# Patient Record
Sex: Female | Born: 1990 | Race: Black or African American | Hispanic: No | Marital: Single | Smoking: Never smoker
Health system: Southern US, Community
[De-identification: ages and names within clinical notes are randomized; demographics above are authoritative.]

## PROBLEM LIST (undated history)

## (undated) DIAGNOSIS — Z8619 Personal history of other infectious and parasitic diseases: Secondary | ICD-10-CM

---

## 2016-10-01 ENCOUNTER — Ambulatory Visit (HOSPITAL_COMMUNITY)
Admission: EM | Admit: 2016-10-01 | Discharge: 2016-10-01 | Disposition: A | Discharge status: Home / Self Care | Payer: Self-pay | Attending: Internal Medicine | Admitting: Internal Medicine

## 2016-10-01 ENCOUNTER — Encounter (HOSPITAL_COMMUNITY): Payer: Self-pay | Admitting: Emergency Medicine

## 2016-10-01 DIAGNOSIS — R21 Rash and other nonspecific skin eruption: Secondary | ICD-10-CM

## 2016-10-01 HISTORY — DX: Personal history of other infectious and parasitic diseases: Z86.19

## 2016-10-01 MED ORDER — VALACYCLOVIR HCL 1 G PO TABS: 1000.0000 mg | ORAL_TABLET | Freq: Three times a day (TID) | ORAL | 0 refills | Status: DC

## 2016-10-01 MED ORDER — HYDROCORTISONE 1 % EX CREA: TOPICAL_CREAM | CUTANEOUS | 0 refills | Status: DC

## 2016-10-01 NOTE — Discharge Instructions (Signed)
°  No Primary Care Doctor: °Call [facility] at  [phone] - they can help you locate a primary care doctor that  accepts your insurance, provides certain services, etc. °Physician Referral Service- [phone] ° ° ° °

## 2016-10-01 NOTE — ED Provider Notes (Signed)
CSN: [id number]     Arrival date & time 10/01/16  1543 History   First MD Initiated Contact with Patient 10/01/16 1700     Chief Complaint  Patient presents with  . Rash   (Consider location/radiation/quality/duration/timing/severity/associated sxs/prior Treatment) HPI  Yolanda Fisher is a 26 y.o. female presenting to UC with c/o rash to the Right side of her neck that is mildly burning and itching.  Started yesterday.  She reports hx of shingles above Right eye when she was in the 3rd grade but does not recall ever seeing a specialist for it.  She notes she has been getting intermittent body and joint pain recently and mild headache.  She thinks current rash is recurrence of shingles. No known sick contacts. No recent travel. No fever or chills. No new soaps, lotions, or medications.    Past Medical History:  Diagnosis Date  . History of shingles    History reviewed. No pertinent surgical history. History reviewed. No pertinent family history. Social History  Substance Use Topics  . Smoking status: Never Smoker  . Smokeless tobacco: Never Used  . Alcohol use Yes   OB History    No data available     Review of Systems  Constitutional: Negative for chills and fever.  Gastrointestinal: Negative for abdominal pain, diarrhea, nausea and vomiting.  Musculoskeletal: Positive for arthralgias and myalgias. Negative for joint swelling, neck pain and neck stiffness.  Skin: Positive for color change and rash. Negative for wound.    Allergies  Patient has no known allergies.  Home Medications   Prior to Admission medications   Medication Sig Start Date End Date Taking? Authorizing Provider  hydrocortisone cream 1 % Apply to affected area 2 times daily 10/01/16   Junius Finner'Malley, Aunna Snooks, PA-C  valACYclovir (VALTREX) 1000 MG tablet Take 1 tablet (1,000 mg total) by mouth 3 (three) times daily. 10/01/16   Junius Finner'Malley, Koryn Charlot, PA-C   Meds Ordered and Administered this Visit  Medications - No data to  display  BP 135/76 (BP Location: Left Arm)   Pulse 91   Temp 98.4 F (36.9 C) (Oral)   Resp 16   SpO2 100%  No data found.   Physical Exam  Constitutional: She is oriented to person, place, and time. She appears well-developed and well-nourished. No distress.  HENT:  Head: Normocephalic and atraumatic.  Right Ear: Tympanic membrane normal. No drainage, swelling or tenderness.  Left Ear: Tympanic membrane normal. No drainage, swelling or tenderness.  Nose: Nose normal.  Mouth/Throat: Uvula is midline, oropharynx is clear and moist and mucous membranes are normal.  Eyes: EOM are normal.  Neck: Normal range of motion. Neck supple.  Cardiovascular: Normal rate and regular rhythm.   Pulmonary/Chest: Effort normal and breath sounds normal. No stridor. No respiratory distress. She has no wheezes. She has no rales.  Musculoskeletal: Normal range of motion.  Lymphadenopathy:    She has no cervical adenopathy.  Neurological: She is alert and oriented to person, place, and time.  Skin: Skin is warm and dry. Rash noted. She is not diaphoretic. There is erythema.  Faint erythematous papular rash in linear fashion on Right side of neck, continues around posterior neck. Rash does cross midline onto Left side. minimally tender.   Psychiatric: She has a normal mood and affect. Her behavior is normal.  Nursing note and vitals reviewed.   Urgent Care Course     Procedures (including critical care time)  Labs Review Labs Reviewed - No data to display  Imaging Review No results found.    MDM   1. Rash and nonspecific skin eruption    Pt states rash is c/w prior shingles rash when she was in 3rd grade.  Rash looks c/w shingles, however, it does cross midline. Uncharacteristic of shingles. Will start pt on valtrex, however, will also prescribe hydrocortisone cream to help with irritation of rash.  F/u with PCP. Resource guide provided. encouraged to f/u sooner if she develops pain or  rash on Right ear. Home care instructions provided.    Junius Finner, PA-C 10/01/16 1911

## 2016-10-01 NOTE — ED Triage Notes (Signed)
The [facility] with a complaint of a rash on her neck that she believed to be a recurrence of shingles.

## 2020-04-21 ENCOUNTER — Other Ambulatory Visit: Payer: Self-pay

## 2020-04-21 ENCOUNTER — Emergency Department (INDEPENDENT_AMBULATORY_CARE_PROVIDER_SITE_OTHER)
Admission: EM | Admit: 2020-04-21 | Discharge: 2020-04-21 | Disposition: A | Discharge status: Home / Self Care | Payer: Self-pay | Source: Home / Self Care

## 2020-04-21 ENCOUNTER — Other Ambulatory Visit (HOSPITAL_COMMUNITY)
Admission: RE | Admit: 2020-04-21 | Discharge: 2020-04-21 | Disposition: A | Discharge status: Home / Self Care | Payer: Medicaid - Out of State | Source: Ambulatory Visit | Attending: Family Medicine | Admitting: Family Medicine

## 2020-04-21 DIAGNOSIS — N760 Acute vaginitis: Secondary | ICD-10-CM

## 2020-04-21 DIAGNOSIS — N898 Other specified noninflammatory disorders of vagina: Secondary | ICD-10-CM | POA: Diagnosis not present

## 2020-04-21 DIAGNOSIS — Z8742 Personal history of other diseases of the female genital tract: Secondary | ICD-10-CM | POA: Diagnosis not present

## 2020-04-21 DIAGNOSIS — R109 Unspecified abdominal pain: Secondary | ICD-10-CM | POA: Insufficient documentation

## 2020-04-21 DIAGNOSIS — R300 Dysuria: Secondary | ICD-10-CM | POA: Diagnosis not present

## 2020-04-21 DIAGNOSIS — B9689 Other specified bacterial agents as the cause of diseases classified elsewhere: Secondary | ICD-10-CM

## 2020-04-21 LAB — POCT URINALYSIS DIP (MANUAL ENTRY)
Bilirubin, UA: NEGATIVE
Blood, UA: NEGATIVE
Glucose, UA: NEGATIVE mg/dL
Leukocytes, UA: NEGATIVE
Nitrite, UA: NEGATIVE
Protein Ur, POC: NEGATIVE mg/dL
Spec Grav, UA: 1.025 (ref 1.010–1.025)
Urobilinogen, UA: 2 E.U./dL — AB
pH, UA: 7 (ref 5.0–8.0)

## 2020-04-21 MED ORDER — METRONIDAZOLE 0.75 % VA GEL: 1.0000 | Freq: Two times a day (BID) | VAGINAL | 0 refills | Status: DC

## 2020-04-21 NOTE — ED Provider Notes (Signed)
[facility]    CSN: [id number] Arrival date & time: 04/21/20  1451      History   Chief Complaint Chief Complaint  Patient presents with  . Dysuria  . Vaginal Discharge    HPI Yolanda Fisher is a 29 y.o. female.   Patient complains of vaginal discharge for 2 weeks.  History of BV.  She has had some abdominal cramping.  HPI  Past Medical History:  Diagnosis Date  . History of shingles     There are no problems to display for this patient.   History reviewed. No pertinent surgical history.  OB History   No obstetric history on file.      Home Medications    Prior to Admission medications   Medication Sig Start Date End Date Taking? Authorizing Provider  hydrocortisone cream 1 % Apply to affected area 2 times daily 10/01/16   Lurene Shadow, PA-C  valACYclovir (VALTREX) 1000 MG tablet Take 1 tablet (1,000 mg total) by mouth 3 (three) times daily. 10/01/16   Lurene Shadow, PA-C    Family History History reviewed. No pertinent family history.  Social History Social History   Tobacco Use  . Smoking status: Never Smoker  . Smokeless tobacco: Never Used  Vaping Use  . Vaping Use: Never used  Substance Use Topics  . Alcohol use: Yes  . Drug use: No     Allergies   Patient has no known allergies.   Review of Systems Review of Systems  Gastrointestinal: Positive for abdominal pain.  Genitourinary: Positive for vaginal discharge.     Physical Exam Triage Vital Signs ED Triage Vitals  Enc Vitals Group     BP 04/21/20 1528 129/74     Pulse Rate 04/21/20 1528 88     Resp 04/21/20 1528 18     Temp 04/21/20 1528 98.4 F (36.9 C)     Temp Source 04/21/20 1528 Oral     SpO2 04/21/20 1528 99 %     Weight --      Height --      Head Circumference --      Peak Flow --      Pain Score 04/21/20 1521 3     Pain Loc --      Pain Edu? --      Excl. in GC? --    No data found.  Updated Vital Signs BP 129/74 (BP Location: Right Arm)    Pulse 88   Temp 98.4 F (36.9 C) (Oral)   Resp 18   LMP 04/05/2020   SpO2 99%   Visual Acuity Right Eye Distance:   Left Eye Distance:   Bilateral Distance:    Right Eye Near:   Left Eye Near:    Bilateral Near:     Physical Exam Vitals and nursing note reviewed.  Constitutional:      Appearance: Normal appearance.  Cardiovascular:     Rate and Rhythm: Normal rate.  Pulmonary:     Effort: Pulmonary effort is normal.  Genitourinary:    Comments: Self collected vaginal specimen will check for BV chlamydia trichomonas Neurological:     Mental Status: She is alert.      UC Treatments / Results  Labs (all labs ordered are listed, but only abnormal results are displayed) Labs Reviewed  POCT URINALYSIS DIP (MANUAL ENTRY) - Abnormal; Notable for the following components:      Result Value   Ketones, POC UA trace (5) (*)  Urobilinogen, UA 2.0 (*)    All other components within normal limits    EKG   Radiology No results found.  Procedures Procedures (including critical care time)  Medications Ordered in UC Medications - No data to display  Initial Impression / Assessment and Plan / UC Course  I have reviewed the triage vital signs and the nursing notes.  Pertinent labs & imaging results that were available during my care of the patient were reviewed by me and considered in my medical decision making (see chart for details).     By history, symptoms are consistent with bacterial vaginosis we will submit cytology for prove.  Patient requests topical metronidazole since she is still breast-feeding. Final Clinical Impressions(s) / UC Diagnoses   Final diagnoses:  None   Discharge Instructions   None    ED Prescriptions    None     PDMP not reviewed this encounter.   Frederica Kuster, MD 04/21/20 [phone]

## 2020-04-21 NOTE — ED Triage Notes (Signed)
Pt c/o dysuria and abnormal vaginal discharge x 2 weeks. Hx of BV. Some abd cramping x 2 days.

## 2020-04-22 LAB — CYTOLOGY, (ORAL, ANAL, URETHRAL) ANCILLARY ONLY
Bacterial Vaginitis (gardnerella): POSITIVE — AB
Chlamydia: NEGATIVE
Comment: NEGATIVE
Comment: NEGATIVE
Comment: NEGATIVE
Comment: NORMAL
Neisseria Gonorrhea: NEGATIVE
Trichomonas: NEGATIVE

## 2020-06-01 ENCOUNTER — Encounter (HOSPITAL_COMMUNITY): Payer: Self-pay | Admitting: *Deleted

## 2020-06-01 ENCOUNTER — Emergency Department (HOSPITAL_COMMUNITY)
Admission: EM | Admit: 2020-06-01 | Discharge: 2020-06-01 | Disposition: A | Discharge status: Home / Self Care | Payer: Medicaid - Out of State | Attending: Emergency Medicine | Admitting: Emergency Medicine

## 2020-06-01 ENCOUNTER — Other Ambulatory Visit: Payer: Self-pay

## 2020-06-01 DIAGNOSIS — Z5321 Procedure and treatment not carried out due to patient leaving prior to being seen by health care provider: Secondary | ICD-10-CM | POA: Insufficient documentation

## 2020-06-01 DIAGNOSIS — M79662 Pain in left lower leg: Secondary | ICD-10-CM | POA: Insufficient documentation

## 2020-06-01 NOTE — ED Notes (Signed)
Pt states she needs to go. Did not sign

## 2020-06-01 NOTE — ED Triage Notes (Signed)
Pain in left calf for 2 weeks

## 2020-06-02 ENCOUNTER — Encounter (HOSPITAL_COMMUNITY): Payer: Self-pay | Admitting: Emergency Medicine

## 2020-06-02 ENCOUNTER — Emergency Department (HOSPITAL_COMMUNITY): Payer: Medicaid - Out of State

## 2020-06-02 ENCOUNTER — Emergency Department (HOSPITAL_COMMUNITY)
Admission: EM | Admit: 2020-06-02 | Discharge: 2020-06-02 | Disposition: A | Discharge status: Home / Self Care | Payer: Medicaid - Out of State | Attending: Emergency Medicine | Admitting: Emergency Medicine

## 2020-06-02 DIAGNOSIS — M79662 Pain in left lower leg: Secondary | ICD-10-CM | POA: Insufficient documentation

## 2020-06-02 NOTE — ED Triage Notes (Signed)
Pt c/o left leg pain that started the end of December.

## 2020-06-02 NOTE — Discharge Instructions (Addendum)
Your ultrasound is negative for any sign of a blood clot.  You may try a heating pad and ibuprofen 400 mg three times daily to see if this will improve your symptoms.  Follow up with your primary MD for any new or worsened symptoms.

## 2020-06-02 NOTE — ED Notes (Signed)
Ultrasound tech at bedside

## 2020-06-04 NOTE — ED Provider Notes (Signed)
[facility] EMERGENCY DEPARTMENT Provider Note   CSN: [id number] Arrival date & time: 06/02/20  1116     History Chief Complaint  Patient presents with  . Leg Pain    Yolanda Fisher is a 30 y.o. female with no significant past medical history presenting for evaluation of intermittent left calf pain presenting for about the past 6 weeks, shortly after she was diagnosed with Covid 19.  She denies swelling of the extremity, also denies injury, direct blows to the site, also no skin changes, rash, radiation of pain into the thigh, no cp or sob.  She describes a pulling sensation, worsened with standing and flexing her ankle. She is concerned about possible dvt. No hx of this condition and no other risk factors other than recent covid infection.  She has found no alleviators for her symptoms.       The history is provided by the patient.       Past Medical History:  Diagnosis Date  . History of shingles     There are no problems to display for this patient.   No past surgical history on file.   OB History   No obstetric history on file.     No family history on file.  Social History   Tobacco Use  . Smoking status: Never Smoker  . Smokeless tobacco: Never Used  Vaping Use  . Vaping Use: Never used  Substance Use Topics  . Alcohol use: Yes    Comment: occasionally  . Drug use: No    Home Medications Prior to Admission medications   Medication Sig Start Date End Date Taking? Authorizing Provider  hydrocortisone cream 1 % Apply to affected area 2 times daily 10/01/16   Lurene Shadow, PA-C  metroNIDAZOLE (METROGEL VAGINAL) 0.75 % vaginal gel Place 1 Applicatorful vaginally 2 (two) times daily. 04/21/20   Frederica Kuster, MD  valACYclovir (VALTREX) 1000 MG tablet Take 1 tablet (1,000 mg total) by mouth 3 (three) times daily. 10/01/16   Lurene Shadow, PA-C    Allergies    Patient has no known allergies.  Review of Systems   Review of Systems  Constitutional:  Negative for chills and fever.  Respiratory: Negative for shortness of breath.   Cardiovascular: Negative for chest pain, palpitations and leg swelling.  Musculoskeletal: Positive for arthralgias. Negative for joint swelling and myalgias.  Skin: Negative for color change and rash.  Neurological: Negative for weakness and numbness.  All other systems reviewed and are negative.   Physical Exam Updated Vital Signs BP 121/76   Pulse 70   Temp 98.1 F (36.7 C) (Oral)   Resp 16   Ht 5\' 5"  (1.651 m)   Wt 73.9 kg   LMP 05/27/2020   SpO2 100%   BMI 27.12 kg/m   Physical Exam Vitals and nursing note reviewed.  Constitutional:      Appearance: She is well-developed and well-nourished.  HENT:     Head: Normocephalic and atraumatic.  Cardiovascular:     Rate and Rhythm: Normal rate and regular rhythm.     Pulses: Normal pulses and intact distal pulses.          Dorsalis pedis pulses are 2+ on the right side and 2+ on the left side.  Pulmonary:     Effort: Pulmonary effort is normal.  Musculoskeletal:        General: No swelling. Normal range of motion.     Cervical back: Normal range of motion.  Right lower leg: No edema.     Left lower leg: No swelling, tenderness or bony tenderness. No edema.     Comments: No palpable cords, no edema, calf is soft, no induration or rash.  Positive Homan's  Skin:    General: Skin is warm and dry.  Neurological:     General: No focal deficit present.     Mental Status: She is alert.  Psychiatric:        Mood and Affect: Mood and affect normal.     ED Results / Procedures / Treatments   Labs (all labs ordered are listed, but only abnormal results are displayed) Labs Reviewed - No data to display  EKG None  Radiology  US Venous Img Lower Unilateral Left  Result Date: 06/02/2020 CLINICAL DATA:  Pain of the left calf EXAM: LEFT LOWER EXTREMITY VENOUS DOPPLER ULTRASOUND TECHNIQUE: Gray-scale sonography with compression, as well as color  and duplex ultrasound, were performed to evaluate the deep venous system(s) from the level of the common femoral vein through the popliteal and proximal calf veins. COMPARISON:  None. FINDINGS: VENOUS Normal compressibility of the common femoral, superficial femoral, and popliteal veins, as well as the visualized calf veins. Visualized portions of profunda femoral vein and great saphenous vein unremarkable. No filling defects to suggest DVT on grayscale or color Doppler imaging. Doppler waveforms show normal direction of venous flow, normal respiratory plasticity and response to augmentation. Limited views of the contralateral common femoral vein are unremarkable. OTHER None. Limitations: none IMPRESSION: Negative. Electronically Signed   By: Elige Ko   On: 06/02/2020 13:49    Procedures Procedures   Medications Ordered in ED Medications - No data to display  ED Course  I have reviewed the triage vital signs and the nursing notes.  Pertinent labs & imaging results that were available during my care of the patient were reviewed by me and considered in my medical decision making (see chart for details).    MDM Rules/Calculators/A&P                          Imaging reviewed and discussed with pt, reassurance given, no dvt.  Suspect msk source, suggested ibuprofen, heat tx, f/u pcp if sx persist or do not improve with tx plan. Final Clinical Impression(s) / ED Diagnoses Final diagnoses:  Pain of left calf    Rx / DC Orders ED Discharge Orders    None       Victoriano Lain 06/04/20 1612    Terald Sleeper, MD 06/04/20 [phone]

## 2020-07-28 ENCOUNTER — Emergency Department
Admission: EM | Admit: 2020-07-28 | Discharge: 2020-07-28 | Disposition: A | Discharge status: Home / Self Care | Payer: 59 | Source: Home / Self Care | Attending: Family Medicine | Admitting: Family Medicine

## 2020-07-28 ENCOUNTER — Encounter: Payer: Self-pay | Admitting: Emergency Medicine

## 2020-07-28 ENCOUNTER — Other Ambulatory Visit: Payer: Self-pay

## 2020-07-28 ENCOUNTER — Other Ambulatory Visit (HOSPITAL_COMMUNITY)
Admission: RE | Admit: 2020-07-28 | Discharge: 2020-07-28 | Disposition: A | Discharge status: Home / Self Care | Payer: 59 | Source: Ambulatory Visit | Attending: Family Medicine | Admitting: Family Medicine

## 2020-07-28 DIAGNOSIS — R3589 Other polyuria: Secondary | ICD-10-CM | POA: Insufficient documentation

## 2020-07-28 DIAGNOSIS — N760 Acute vaginitis: Secondary | ICD-10-CM | POA: Insufficient documentation

## 2020-07-28 DIAGNOSIS — Z113 Encounter for screening for infections with a predominantly sexual mode of transmission: Secondary | ICD-10-CM | POA: Insufficient documentation

## 2020-07-28 DIAGNOSIS — N898 Other specified noninflammatory disorders of vagina: Secondary | ICD-10-CM | POA: Diagnosis present

## 2020-07-28 DIAGNOSIS — B9689 Other specified bacterial agents as the cause of diseases classified elsewhere: Secondary | ICD-10-CM | POA: Diagnosis not present

## 2020-07-28 LAB — POCT URINALYSIS DIP (MANUAL ENTRY)
Bilirubin, UA: NEGATIVE
Glucose, UA: NEGATIVE mg/dL
Ketones, POC UA: NEGATIVE mg/dL
Nitrite, UA: NEGATIVE
Protein Ur, POC: NEGATIVE mg/dL
Spec Grav, UA: >=1.03 — AB (ref 1.010–1.025)
Urobilinogen, UA: 1 E.U./dL
pH, UA: 6 (ref 5.0–8.0)

## 2020-07-28 MED ORDER — METRONIDAZOLE 0.75 % VA GEL: VAGINAL | 0 refills | Status: DC

## 2020-07-28 NOTE — ED Triage Notes (Signed)
Polyuria, mild bladder pain x 1 week

## 2020-07-28 NOTE — Discharge Instructions (Addendum)
Try using a probiotic (such as Femdophilus) for recurring BV

## 2020-07-28 NOTE — ED Provider Notes (Signed)
[facility]    CSN: [id number] Arrival date & time: 07/28/20  1358      History   Chief Complaint Chief Complaint  Patient presents with  . Polyuria    HPI Yolanda Fisher is a 30 y.o. female.   Patient complains of urinary frequency and dysuria for about one week, becoming worse today.  She has a history of recurrent BV and believes that she may have BV again.  She denies abdominal/pelvic pain, fever, nausea/vomiting.  Patient's last menstrual period was 07/21/2020 (exact date).   The history is provided by the patient.    Past Medical History:  Diagnosis Date  . History of shingles     There are no problems to display for this patient.   History reviewed. No pertinent surgical history.  OB History   No obstetric history on file.      Home Medications    Prior to Admission medications   Medication Sig Start Date End Date Taking? Authorizing Provider  metroNIDAZOLE (METROGEL VAGINAL) 0.75 % vaginal gel Place one applicatorful in vagina QHS for 5 days 07/28/20  Yes Lattie Haw, MD    Family History Family History  Problem Relation Age of Onset  . Renal Disease Father     Social History Social History   Tobacco Use  . Smoking status: Never Smoker  . Smokeless tobacco: Never Used  Vaping Use  . Vaping Use: Never used  Substance Use Topics  . Alcohol use: Yes    Comment: occasionally  . Drug use: No     Allergies   Patient has no known allergies.   Review of Systems Review of Systems   Physical Exam Triage Vital Signs ED Triage Vitals  Enc Vitals Group     BP 07/28/20 1507 120/75     Pulse Rate 07/28/20 1507 86     Resp 07/28/20 1507 18     Temp 07/28/20 1507 98.4 F (36.9 C)     Temp Source 07/28/20 1507 Oral     SpO2 07/28/20 1507 98 %     Weight 07/28/20 1508 163 lb (73.9 kg)     Height 07/28/20 1508 5\' 5"  (1.651 m)     Head Circumference --      Peak Flow --      Pain Score 07/28/20 1508 1     Pain Loc --       Pain Edu? --      Excl. in GC? --    No data found.  Updated Vital Signs BP 120/75 (BP Location: Right Arm)   Pulse 86   Temp 98.4 F (36.9 C) (Oral)   Resp 18   Ht 5\' 5"  (1.651 m)   Wt 73.9 kg   LMP 07/21/2020 (Exact Date)   SpO2 98%   BMI 27.12 kg/m   Visual Acuity Right Eye Distance:   Left Eye Distance:   Bilateral Distance:    Right Eye Near:   Left Eye Near:    Bilateral Near:     Physical Exam Nursing notes and Vital Signs reviewed. Appearance:  Patient appears stated age, and in no acute distress.    Eyes:  Pupils are equal, round, and reactive to light and accomodation.  Extraocular movement is intact.  Conjunctivae are not inflamed   Pharynx:  Normal; moist mucous membranes  Neck:  Supple.  No adenopathy Lungs:  Clear to auscultation.  Breath sounds are equal.  Moving air well. Heart:  Regular rate and rhythm  without murmurs, rubs, or gallops.  Abdomen:  Nontender without masses or hepatosplenomegaly.  Bowel sounds are present.  No CVA or flank tenderness.  Extremities:  No edema.  Skin:  No rash present.     UC Treatments / Results  Labs (all labs ordered are listed, but only abnormal results are displayed) Labs Reviewed  POCT URINALYSIS DIP (MANUAL ENTRY) - Abnormal; Notable for the following components:      Result Value   Color, UA yellow (*)    Spec Grav, UA >=1.030 (*)    Blood, UA small (*)    Leukocytes, UA Small (1+) (*)    All other components within normal limits  URINE CULTURE  CERVICOVAGINAL ANCILLARY ONLY    EKG   Radiology No results found.  Procedures Procedures (including critical care time)  Medications Ordered in UC Medications - No data to display  Initial Impression / Assessment and Plan / UC Course  I have reviewed the triage vital signs and the nursing notes.  Pertinent labs & imaging results that were available during my care of the patient were reviewed by me and considered in my medical decision making (see chart  for details).    Urine culture and cervicovaginal ancillary pending. Begin empiric Metrogel vaginal. Followup with GYN if not improving.   Final Clinical Impressions(s) / UC Diagnoses   Final diagnoses:  Polyuria  Vaginal discharge     Discharge Instructions     Try using a probiotic (such as Femdophilus) for recurring BV    ED Prescriptions    Medication Sig Dispense Auth. Provider   metroNIDAZOLE (METROGEL VAGINAL) 0.75 % vaginal gel Place one applicatorful in vagina QHS for 5 days 70 g Lattie Haw, MD        Lattie Haw, MD 07/30/20 [phone]

## 2020-07-29 LAB — CERVICOVAGINAL ANCILLARY ONLY
Bacterial Vaginitis (gardnerella): POSITIVE — AB
Candida Glabrata: NEGATIVE
Candida Vaginitis: NEGATIVE
Chlamydia: NEGATIVE
Comment: NEGATIVE
Comment: NEGATIVE
Comment: NEGATIVE
Comment: NEGATIVE
Comment: NEGATIVE
Comment: NORMAL
Neisseria Gonorrhea: NEGATIVE
Trichomonas: NEGATIVE

## 2020-07-30 LAB — URINE CULTURE
MICRO NUMBER:: 11741057
SPECIMEN QUALITY:: ADEQUATE

## 2020-08-19 ENCOUNTER — Telehealth: Payer: Self-pay

## 2020-08-19 NOTE — Telephone Encounter (Signed)
Pt left VM on nurse line requesting diflucan. Advised to be reevaluated due to length of time between visits.  Offered a televisit as an option as well. Pt acknowledged advice.

## 2020-10-04 ENCOUNTER — Emergency Department
Admission: EM | Admit: 2020-10-04 | Discharge: 2020-10-04 | Disposition: A | Discharge status: Home / Self Care | Payer: 59 | Source: Home / Self Care

## 2020-10-04 ENCOUNTER — Other Ambulatory Visit (HOSPITAL_COMMUNITY)
Admission: RE | Admit: 2020-10-04 | Discharge: 2020-10-04 | Disposition: A | Discharge status: Home / Self Care | Payer: 59 | Source: Ambulatory Visit | Attending: Family Medicine | Admitting: Family Medicine

## 2020-10-04 ENCOUNTER — Other Ambulatory Visit: Payer: Self-pay

## 2020-10-04 ENCOUNTER — Encounter: Payer: Self-pay | Admitting: Emergency Medicine

## 2020-10-04 DIAGNOSIS — B9689 Other specified bacterial agents as the cause of diseases classified elsewhere: Secondary | ICD-10-CM | POA: Diagnosis not present

## 2020-10-04 DIAGNOSIS — B373 Candidiasis of vulva and vagina: Secondary | ICD-10-CM | POA: Insufficient documentation

## 2020-10-04 DIAGNOSIS — N898 Other specified noninflammatory disorders of vagina: Secondary | ICD-10-CM | POA: Diagnosis not present

## 2020-10-04 DIAGNOSIS — A5402 Gonococcal vulvovaginitis, unspecified: Secondary | ICD-10-CM | POA: Insufficient documentation

## 2020-10-04 DIAGNOSIS — N760 Acute vaginitis: Secondary | ICD-10-CM

## 2020-10-04 DIAGNOSIS — Z113 Encounter for screening for infections with a predominantly sexual mode of transmission: Secondary | ICD-10-CM | POA: Diagnosis not present

## 2020-10-04 LAB — POCT URINALYSIS DIP (MANUAL ENTRY)
Bilirubin, UA: NEGATIVE
Glucose, UA: NEGATIVE mg/dL
Ketones, POC UA: NEGATIVE mg/dL
Nitrite, UA: NEGATIVE
Spec Grav, UA: 1.02 (ref 1.010–1.025)
Urobilinogen, UA: 1 E.U./dL
pH, UA: 7 (ref 5.0–8.0)

## 2020-10-04 LAB — POCT URINE PREGNANCY: Preg Test, Ur: NEGATIVE

## 2020-10-04 MED ORDER — METRONIDAZOLE 0.75 % VA GEL: 1.0000 | Freq: Every day | VAGINAL | 0 refills | Status: AC

## 2020-10-04 NOTE — Discharge Instructions (Addendum)
Urine is not concerning for infection today  Urine pregnancy test is negative  Swab testing will be back in 3-4 days. Do not have sex until results are back and negative or until treatment is complete, whichever is longer  I have prescribed Metrogel for you to use daily for 7 days.  Then you may use Boric Acid vaginal suppositories once daily x 30 days. Then once daily as needed when you have sex, take a bath, etc  We will follow up with any abnormal labs that require further treatment  Follow up with this office or with primary care if symptoms are persisting.  Follow up in the ER for high fever, trouble swallowing, trouble breathing, other concerning symptoms.

## 2020-10-04 NOTE — ED Provider Notes (Signed)
[facility]   [id number] 10/04/20 Arrival Time: 1412   CC: VAGINAL DISCHARGE  SUBJECTIVE:  Yolanda Fisher is a 30 y.o. female who presents with complaints of gradual vaginal discharge that began about a week ago. Reports unprotected sexual encounter and hx recurrent BV. Patient is sexually active. Describes discharge as whitish and watery. Has not tried OTC medications without relief. There are not aggravating or alleviating factors. Reports similar symptoms in the past and was diagnosed with BV. Denies fever, chills, nausea, vomiting, abdominal or pelvic pain, urinary symptoms, vaginal itching, vaginal odor, vaginal bleeding, dyspareunia, vaginal rashes or lesions.   Patient's last menstrual period was 09/12/2020 (exact date). Current birth control method: Compliant with BC:  ROS: As per HPI.  All other pertinent ROS negative.     Past Medical History:  Diagnosis Date   History of shingles    History reviewed. No pertinent surgical history. No Known Allergies No current facility-administered medications on file prior to encounter.   No current outpatient medications on file prior to encounter.    Social History   Socioeconomic History   Marital status: Single    Spouse name: Not on file   Number of children: Not on file   Years of education: Not on file   Highest education level: Not on file  Occupational History   Not on file  Tobacco Use   Smoking status: Never   Smokeless tobacco: Never  Vaping Use   Vaping Use: Never used  Substance and Sexual Activity   Alcohol use: Yes    Comment: occasionally   Drug use: No   Sexual activity: Not on file  Other Topics Concern   Not on file  Social History Narrative   Not on file   Social Determinants of Health   Financial Resource Strain: Not on file  Food Insecurity: Not on file  Transportation Needs: Not on file  Physical Activity: Not on file  Stress: Not on file  Social Connections: Not on file   Intimate Partner Violence: Not on file   Family History  Problem Relation Age of Onset   Renal Disease Father     OBJECTIVE:  Vitals:   10/04/20 1433 10/04/20 1434  BP: 124/75   Pulse: 66   Resp: 16   Temp: 98.9 F (37.2 C)   TempSrc: Oral   Weight:  160 lb (72.6 kg)  Height:  5\' 3"  (1.6 m)     General appearance: Alert, NAD, appears stated age Head: NCAT Throat: lips, mucosa, and tongue normal; teeth and gums normal Lungs: CTA bilaterally without adventitious breath sounds Heart: regular rate and rhythm.  Radial pulses 2+ symmetrical bilaterally Back: no CVA tenderness Abdomen: soft, non-tender; bowel sounds normal; no masses or organomegaly; no guarding or rebound tenderness GU: declines  Skin: warm and dry Psychological:  Alert and cooperative. Normal mood and affect.  LABS:  Results for orders placed or performed during the hospital encounter of 10/04/20  POCT urinalysis dipstick (new)  Result Value Ref Range   Color, UA yellow yellow   Clarity, UA clear clear   Glucose, UA negative negative mg/dL   Bilirubin, UA negative negative   Ketones, POC UA negative negative mg/dL   Spec Grav, UA 10/06/20 6.761 - 1.025   Blood, UA small (A) negative   pH, UA 7.0 5.0 - 8.0   Protein Ur, POC trace (A) negative mg/dL   Urobilinogen, UA 1.0 0.2 or 1.0 E.U./dL   Nitrite, UA Negative Negative  Leukocytes, UA Trace (A) Negative  POCT urine pregnancy  Result Value Ref Range   Preg Test, Ur Negative Negative    Labs Reviewed  POCT URINALYSIS DIP (MANUAL ENTRY) - Abnormal; Notable for the following components:      Result Value   Blood, UA small (*)    Protein Ur, POC trace (*)    Leukocytes, UA Trace (*)    All other components within normal limits  POCT URINE PREGNANCY  CERVICOVAGINAL ANCILLARY ONLY    ASSESSMENT & PLAN:  1. Acute vaginitis   2. Vaginal discharge     Meds ordered this encounter  Medications   metroNIDAZOLE (METROGEL VAGINAL) 0.75 % vaginal  gel    Sig: Place 1 Applicatorful vaginally at bedtime for 5 days.    Dispense:  70 g    Refill:  0    Order Specific Question:   Supervising Provider    Answer:   Merrilee Jansky [[id number]]    Pending: Labs Reviewed  POCT URINALYSIS DIP (MANUAL ENTRY) - Abnormal; Notable for the following components:      Result Value   Blood, UA small (*)    Protein Ur, POC trace (*)    Leukocytes, UA Trace (*)    All other components within normal limits  POCT URINE PREGNANCY  CERVICOVAGINAL ANCILLARY ONLY   Metrogel prescribed Use as directed UA not concerning for infection Urine pregnancy negative in office Cytology self-swab obtained  We will follow up with you regarding abnormal results Take medications as prescribed and to completion If tests results are positive, please abstain from sexual activity until you and your partner(s) have been treated May use Boric Acid vaginal suppositories as needed after sex to help prevent BV Follow up with PCP or [facility] if symptoms persists Return here or go to ER if you have any new or worsening symptoms fever, chills, nausea, vomiting, abdominal or pelvic pain, painful intercourse, persistent symptoms despite treatment Reviewed expectations re: course of current medical issues. Questions answered. Outlined signs and symptoms indicating need for more acute intervention. Patient verbalized understanding. After Visit Summary given.        Moshe Cipro, NP 10/04/20 1556

## 2020-10-04 NOTE — ED Triage Notes (Signed)
White discharge after unprotected sex

## 2020-10-05 LAB — CERVICOVAGINAL ANCILLARY ONLY
Bacterial Vaginitis (gardnerella): POSITIVE — AB
Candida Glabrata: NEGATIVE
Candida Vaginitis: POSITIVE — AB
Chlamydia: NEGATIVE
Comment: NEGATIVE
Comment: NEGATIVE
Comment: NEGATIVE
Comment: NEGATIVE
Comment: NEGATIVE
Comment: NORMAL
Neisseria Gonorrhea: POSITIVE — AB
Trichomonas: NEGATIVE

## 2020-10-06 ENCOUNTER — Emergency Department (INDEPENDENT_AMBULATORY_CARE_PROVIDER_SITE_OTHER)
Admission: EM | Admit: 2020-10-06 | Discharge: 2020-10-06 | Disposition: A | Discharge status: Home / Self Care | Payer: 59 | Source: Home / Self Care

## 2020-10-06 ENCOUNTER — Telehealth (HOSPITAL_COMMUNITY): Payer: Self-pay | Admitting: Emergency Medicine

## 2020-10-06 ENCOUNTER — Other Ambulatory Visit: Payer: Self-pay

## 2020-10-06 DIAGNOSIS — B373 Candidiasis of vulva and vagina: Secondary | ICD-10-CM | POA: Diagnosis not present

## 2020-10-06 DIAGNOSIS — B3731 Acute candidiasis of vulva and vagina: Secondary | ICD-10-CM

## 2020-10-06 MED ORDER — CEFTRIAXONE SODIUM 500 MG IJ SOLR
500.0000 mg | Freq: Once | INTRAMUSCULAR | Status: AC
  Administered 2020-10-06: 14:00:00 500 mg via INTRAMUSCULAR

## 2020-10-06 MED ORDER — FLUCONAZOLE 150 MG PO TABS: ORAL_TABLET | ORAL | 0 refills | Status: AC

## 2020-10-06 NOTE — Telephone Encounter (Signed)
Per protocol, patient will need treatment with Diflucan and IM Rocephin 500 mg.  Went home on Metrogel.  Contacted patient by phone.  Verified identity using two identifiers.  Provided positive result.  Reviewed safe sex practices, notifying partners, and refraining from sexual activities for 7 days from time of treatment.  Patient verified understanding, all questions answered.   [facility]HS notified

## 2020-10-06 NOTE — ED Notes (Signed)
Pt a/o and w/o complaint after Rocephin injection. D/c paper given; pt aware to pick up Rx by A. Vallery Sa, PA at her pharmacy.

## 2020-10-06 NOTE — ED Triage Notes (Signed)
Pt presents to Urgent Care for Rocephin injection for positive Gonorrhea test result.

## 2021-06-09 IMAGING — US US EXTREM LOW VENOUS*L*
1 series · 14 of 24 positions shown · non-contrast
Comparison: None.

CLINICAL DATA: Pain of the left calf

EXAM:
LEFT LOWER EXTREMITY VENOUS DOPPLER ULTRASOUND
TECHNIQUE: Gray-scale sonography with compression, as well as color and duplex
ultrasound, were performed to evaluate the deep venous system(s)
from the level of the common femoral vein through the popliteal and
proximal calf veins.

[Series 1: us venous img lower uni left (dvt) · portal-venous · 14 of 38 slices shown]
[im 1/38]
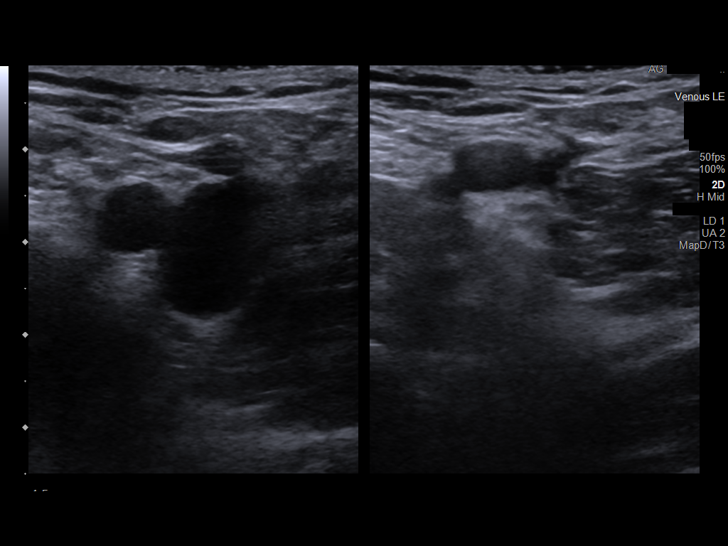
[im 4/38]
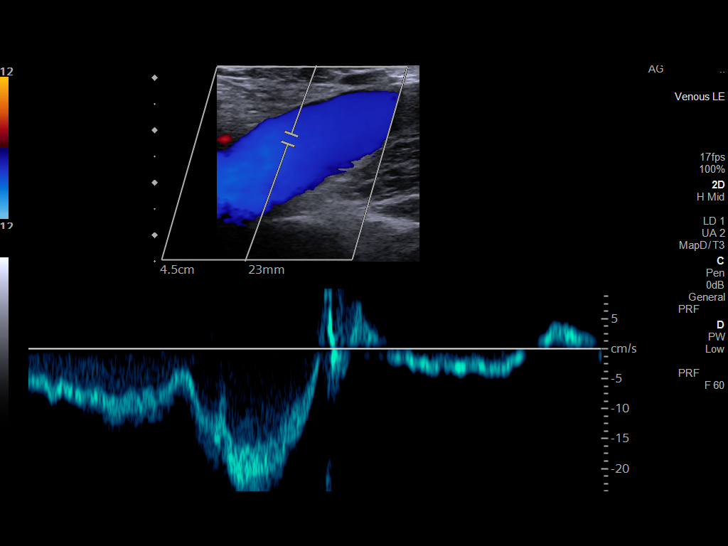
[im 7/38]
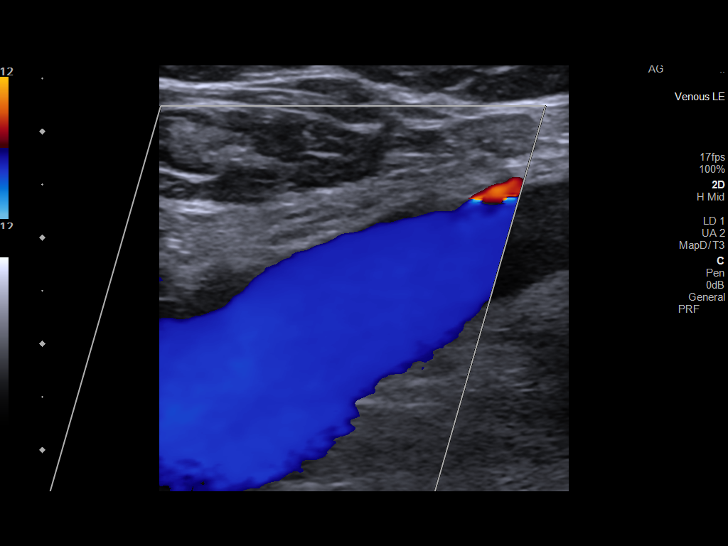
[im 10/38]
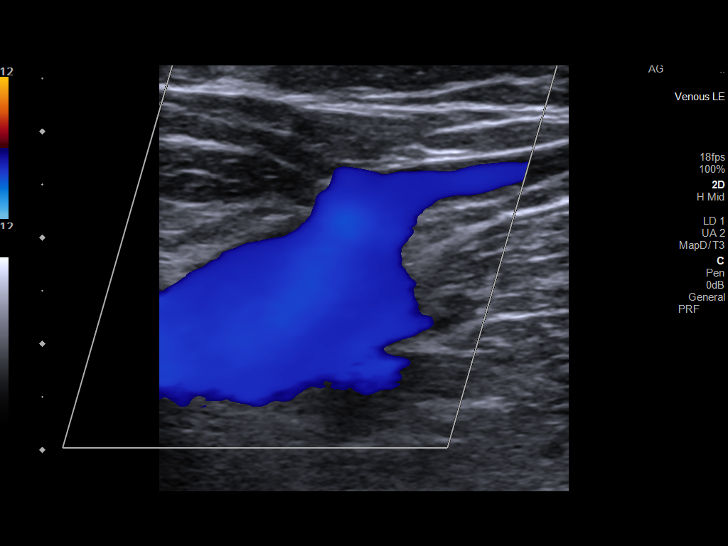
[im 12/38]
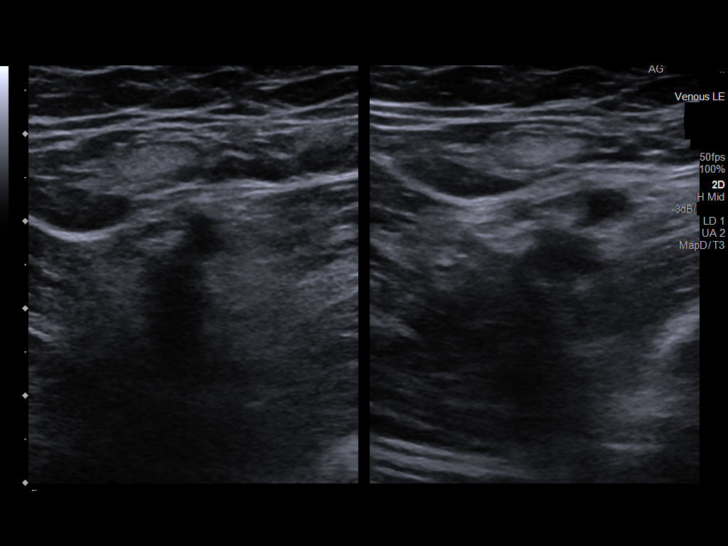
[im 15/38]
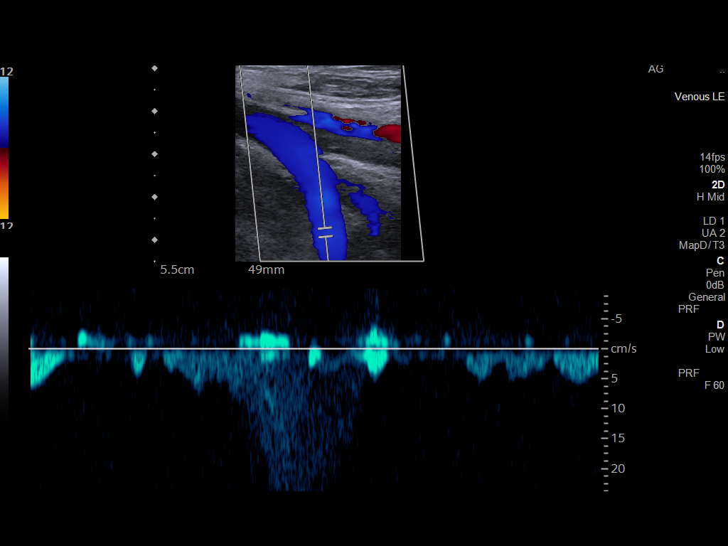
[im 18/38]
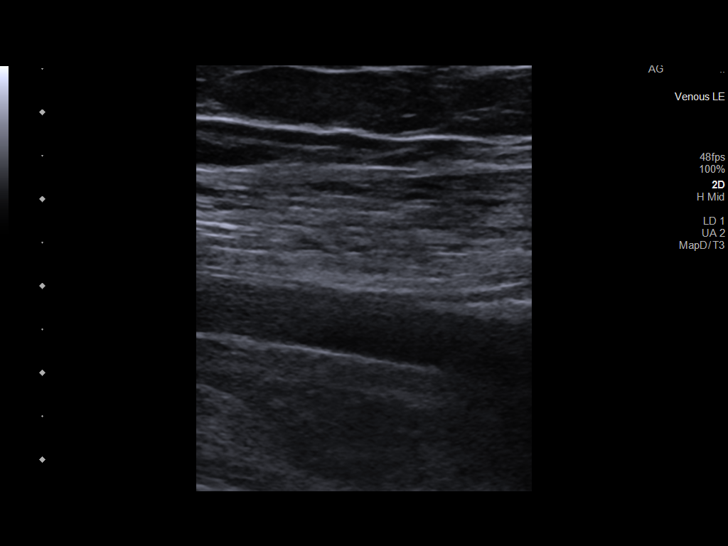
[im 20/38]
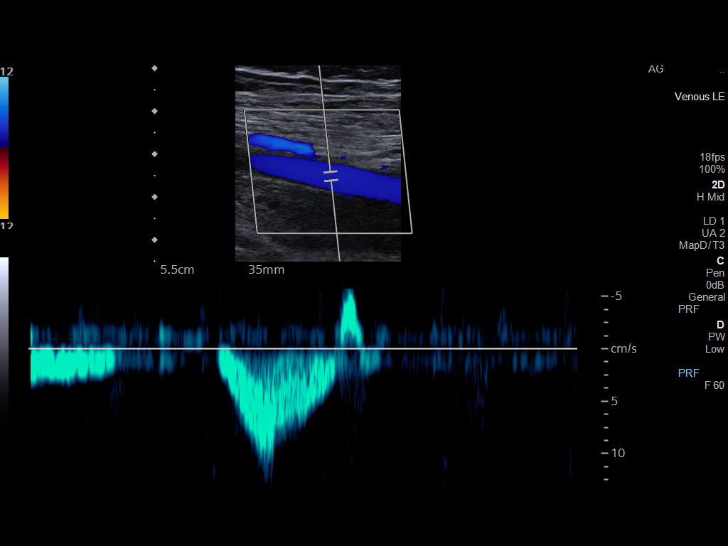
[im 23/38]
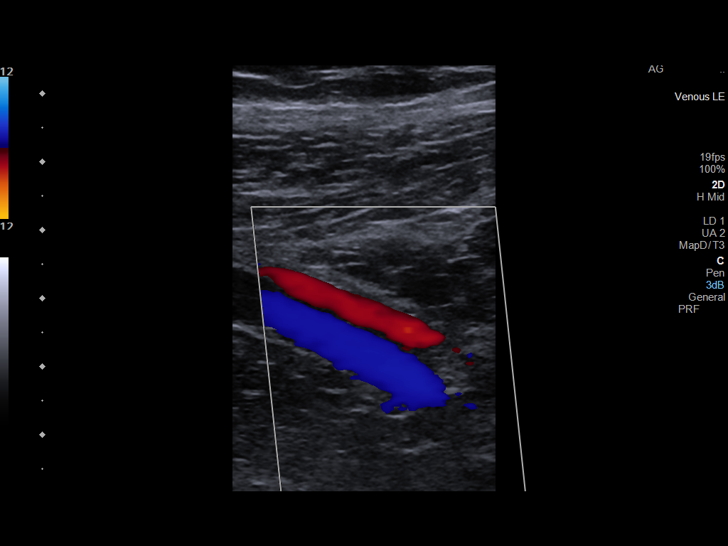
[im 26/38]
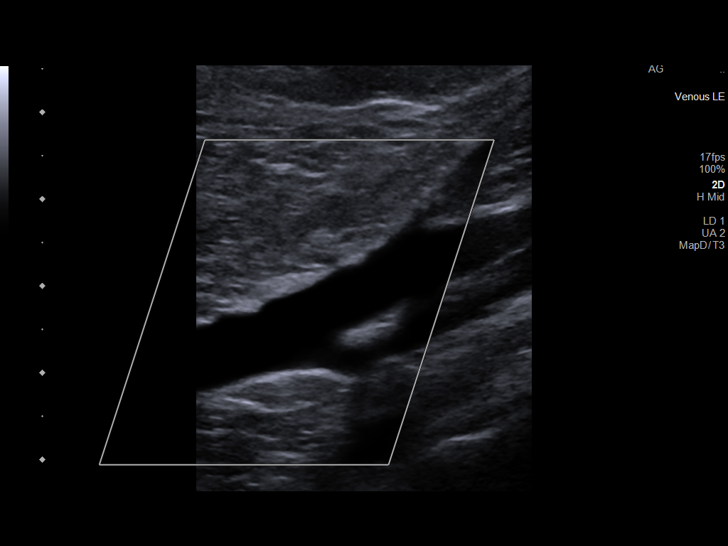
[im 29/38]
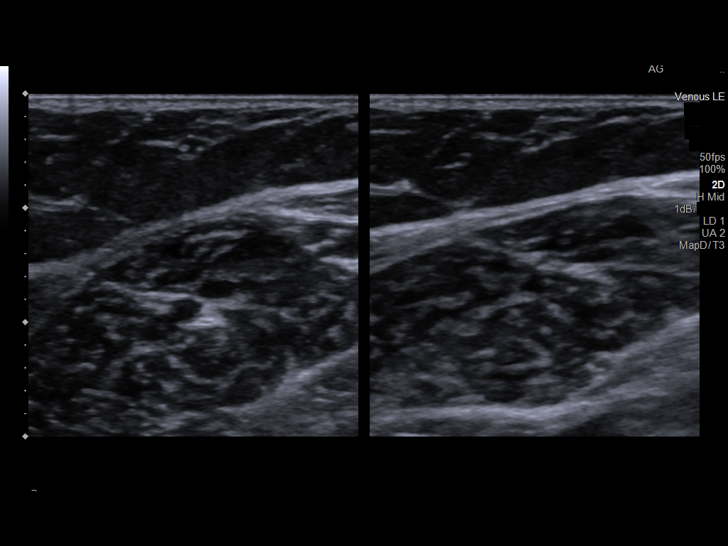
[im 31/38]
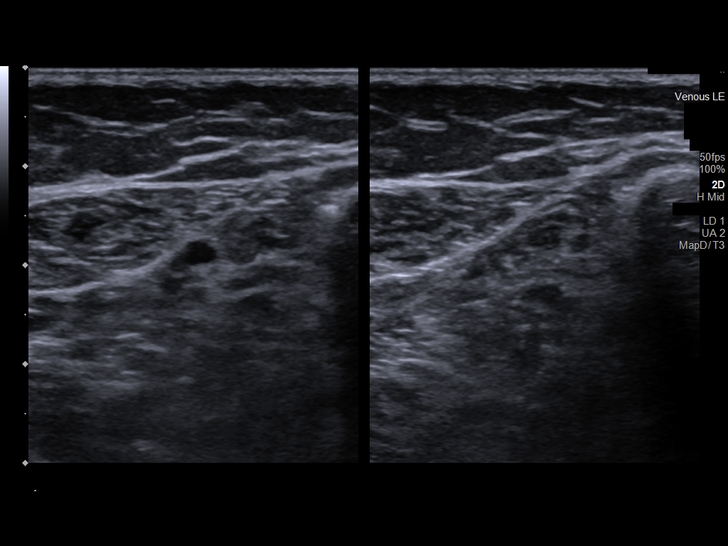
[im 34/38]
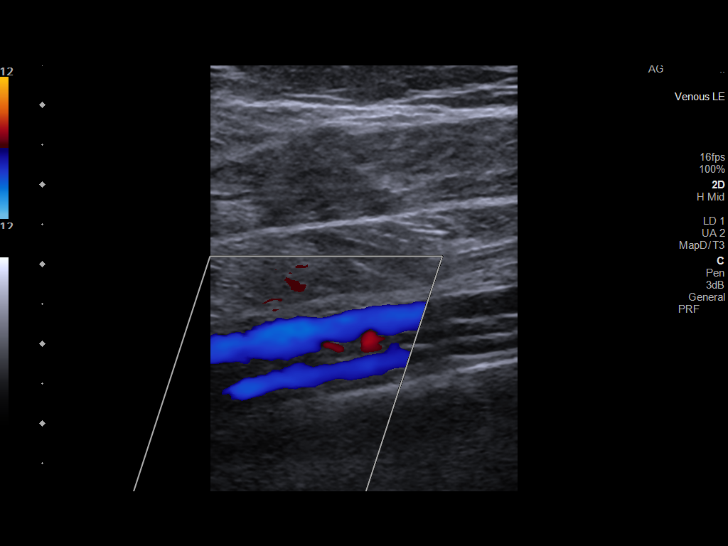
[im 38/38]
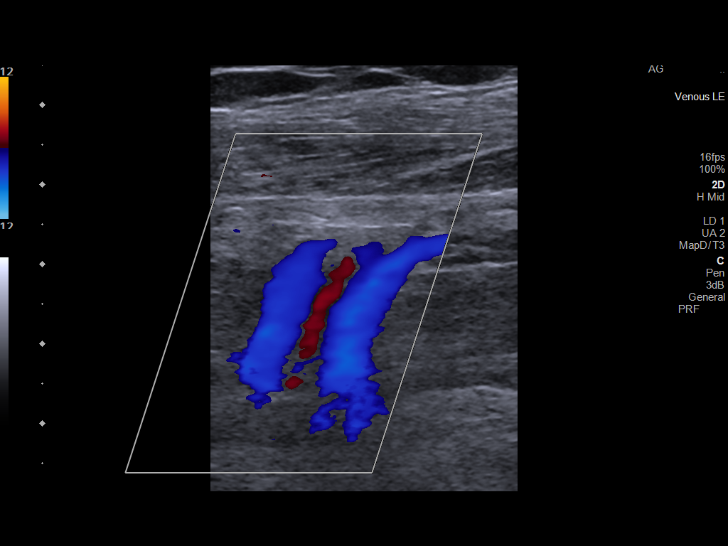

[14 of 24 positions shown; findings below may reference images not displayed]

FINDINGS: VENOUS

Normal compressibility of the common femoral, superficial femoral,
and popliteal veins, as well as the visualized calf veins.
Visualized portions of profunda femoral vein and great saphenous
vein unremarkable. No filling defects to suggest DVT on grayscale or
color Doppler imaging. Doppler waveforms show normal direction of
venous flow, normal respiratory plasticity and response to
augmentation.

Limited views of the contralateral common femoral vein are
unremarkable.

OTHER

None.

Limitations: none
IMPRESSION: Negative.

## 2021-07-15 ENCOUNTER — Emergency Department
Admission: EM | Admit: 2021-07-15 | Discharge: 2021-07-15 | Disposition: A | Discharge status: Home / Self Care | Payer: Self-pay | Source: Home / Self Care

## 2021-07-15 ENCOUNTER — Other Ambulatory Visit: Payer: Self-pay

## 2021-07-15 ENCOUNTER — Other Ambulatory Visit (HOSPITAL_COMMUNITY)
Admission: RE | Admit: 2021-07-15 | Discharge: 2021-07-15 | Disposition: A | Discharge status: Home / Self Care | Payer: Medicaid - Out of State | Source: Ambulatory Visit | Attending: Family Medicine | Admitting: Family Medicine

## 2021-07-15 DIAGNOSIS — N898 Other specified noninflammatory disorders of vagina: Secondary | ICD-10-CM | POA: Insufficient documentation

## 2021-07-15 MED ORDER — METRONIDAZOLE 500 MG PO TABS: 500.0000 mg | ORAL_TABLET | Freq: Two times a day (BID) | ORAL | 0 refills | Status: AC

## 2021-07-15 NOTE — ED Triage Notes (Signed)
Pt here today for abnormal vaginal discharge x 2 weeks. Hx of BV.  ?

## 2021-07-15 NOTE — Discharge Instructions (Addendum)
Advised patient to take medication as directed with food to completion.  Advised patient to avoid alcohol while taking this medicine and 3 days following completion of Flagyl.  Encouraged patient to increase daily water intake while taking this medication.  Advised patient if symptoms worsen and/or unresolved please follow-up with GYN for further evaluation of recurrent infection.  Advised patient of Rubbie Battiest, NP with [facility] is accepting new patient's [phone].  Advised patient we will follow-up with her once lab results are received. ?

## 2021-07-15 NOTE — ED Notes (Signed)
Pt to follow up with GYN in [place] where she resides ?

## 2021-07-15 NOTE — ED Provider Notes (Signed)
?[place] ? ? ? ?CSN: [id number] ?Arrival date & time: 07/15/21  1046 ? ? ?  ? ?History   ?Chief Complaint ?Chief Complaint  ?Patient presents with  ? Vaginal Discharge  ? ? ?HPI ?Yolanda Fisher is a 31 y.o. female.  ? ?HPI [place] 31 year old female presents with vaginal discharge for 2 weeks and reports history of bacterial vaginosis. ? ?Past Medical History:  ?Diagnosis Date  ? History of shingles   ? ? ?There are no problems to display for this patient. ? ? ?History reviewed. No pertinent surgical history. ? ?OB History   ?No obstetric history on file. ?  ? ? ? ?Home Medications   ? ?Prior to Admission medications   ?Medication Sig Start Date End Date Taking? Authorizing Provider  ?metroNIDAZOLE (FLAGYL) 500 MG tablet Take 1 tablet (500 mg total) by mouth 2 (two) times daily. 07/15/21  Yes Eliezer Lofts, FNP  ?fluconazole (DIFLUCAN) 150 MG tablet Take one tablet once. Repeat in 3 days if symptoms persist. 10/06/20   Teodoro Spray L, PA  ? ? ?Family History ?Family History  ?Problem Relation Age of Onset  ? Thyroid disease Mother   ? Hypertension Mother   ? Renal Disease Father   ? ? ?Social History ?Social History  ? ?Tobacco Use  ? Smoking status: Never  ? Smokeless tobacco: Never  ?Vaping Use  ? Vaping Use: Never used  ?Substance Use Topics  ? Alcohol use: Yes  ?  Comment: occasionally  ? Drug use: No  ? ? ? ?Allergies   ?Patient has no known allergies. ? ? ?Review of Systems ?Review of Systems  ?Genitourinary:  Positive for vaginal discharge.  ?All other systems reviewed and are negative. ? ? ?Physical Exam ?Triage Vital Signs ?ED Triage Vitals  ?Enc Vitals Group  ?   BP 07/15/21 1125 125/77  ?   Pulse Rate 07/15/21 1125 97  ?   Resp 07/15/21 1125 16  ?   Temp 07/15/21 1125 98.1 ?F (36.7 ?C)  ?   Temp Source 07/15/21 1125 Oral  ?   SpO2 07/15/21 1125 100 %  ?   Weight --   ?   Height --   ?   Head Circumference --   ?   Peak Flow --   ?   Pain Score 07/15/21 1128 0  ?   Pain Loc --   ?   Pain Edu?  --   ?   Excl. in [place]? --   ? ?No data found. ? ?Updated Vital Signs ?BP 125/77 (BP Location: Right Arm)   Pulse 97   Temp 98.1 ?F (36.7 ?C) (Oral)   Resp 16   LMP 06/29/2021 (Exact Date)   SpO2 100%  ? ? ?Physical Exam ?Vitals and nursing note reviewed.  ?Constitutional:   ?   General: She is not in acute distress. ?   Appearance: Normal appearance. She is normal weight. She is not ill-appearing.  ?HENT:  ?   Head: Normocephalic and atraumatic.  ?   Mouth/Throat:  ?   Mouth: Mucous membranes are moist.  ?   Pharynx: Oropharynx is clear.  ?Eyes:  ?   Extraocular Movements: Extraocular movements intact.  ?   Conjunctiva/sclera: Conjunctivae normal.  ?   Pupils: Pupils are equal, round, and reactive to light.  ?Cardiovascular:  ?   Rate and Rhythm: Normal rate and regular rhythm.  ?   Pulses: Normal pulses.  ?   Heart sounds: Normal heart sounds.  No murmur heard. ?Pulmonary:  ?   Effort: Pulmonary effort is normal.  ?   Breath sounds: No wheezing, rhonchi or rales.  ?Musculoskeletal:  ?   Cervical back: Normal range of motion and neck supple.  ?Skin: ?   General: Skin is warm and dry.  ?Neurological:  ?   General: No focal deficit present.  ?   Mental Status: She is alert and oriented to person, place, and time. Mental status is at baseline.  ? ? ? ?UC Treatments / Results  ?Labs ?(all labs ordered are listed, but only abnormal results are displayed) ?Labs Reviewed  ?CERVICOVAGINAL ANCILLARY ONLY  ? ? ?EKG ? ? ?Radiology ?No results found. ? ?Procedures ?Procedures (including critical care time) ? ?Medications Ordered in UC ?Medications - No data to display ? ?Initial Impression / Assessment and Plan / UC Course  ?I have reviewed the triage vital signs and the nursing notes. ? ?Pertinent labs & imaging results that were available during my care of the patient were reviewed by me and considered in my medical decision making (see chart for details). ? ?  ? ?MDM: 1.  Vaginal discharge-Aptima swab ordered, Rx'd  Flagyl. Advised patient to take medication as directed with food to completion.  Advised patient to avoid alcohol while taking this medicine and 3 days following completion of Flagyl.  Encouraged patient to increase daily water intake while taking this medication.  Advised patient if symptoms worsen and/or unresolved please follow-up with GYN for further evaluation of recurrent infection.  Advised patient of Rubbie Battiest, NP with [facility] is accepting new patient's [phone].  Advised patient we will follow-up with her once lab results are received.  Patient discharged home, hemodynamically stable. ?Final Clinical Impressions(s) / UC Diagnoses  ? ?Final diagnoses:  ?Vaginal discharge  ? ? ? ?Discharge Instructions   ? ?  ?Advised patient to take medication as directed with food to completion.  Advised patient to avoid alcohol while taking this medicine and 3 days following completion of Flagyl.  Encouraged patient to increase daily water intake while taking this medication.  Advised patient if symptoms worsen and/or unresolved please follow-up with GYN for further evaluation of recurrent infection.  Advised patient of Rubbie Battiest, NP with [facility] is accepting new patient's [phone].  Advised patient we will follow-up with her once lab results are received. ? ? ? ? ?ED Prescriptions   ? ? Medication Sig Dispense Auth. Provider  ? metroNIDAZOLE (FLAGYL) 500 MG tablet Take 1 tablet (500 mg total) by mouth 2 (two) times daily. 14 tablet Eliezer Lofts, FNP  ? ?  ? ?PDMP not reviewed this encounter. ?  ?Eliezer Lofts, [place] ?07/15/21 1251 ? ?

## 2021-07-18 LAB — CERVICOVAGINAL ANCILLARY ONLY
Bacterial Vaginitis (gardnerella): NEGATIVE
Candida Glabrata: NEGATIVE
Candida Vaginitis: NEGATIVE
Chlamydia: NEGATIVE
Comment: NEGATIVE
Comment: NEGATIVE
Comment: NEGATIVE
Comment: NEGATIVE
Comment: NEGATIVE
Comment: NORMAL
Neisseria Gonorrhea: NEGATIVE
Trichomonas: NEGATIVE
